# Patient Record
Sex: Female | Born: 1990 | Hispanic: Yes | Marital: Single | Smoking: Never smoker
Health system: Southern US, Community
[De-identification: ages and names within clinical notes are randomized; demographics above are authoritative.]

---

## 2011-12-08 ENCOUNTER — Emergency Department: Payer: Self-pay | Admitting: Emergency Medicine

## 2011-12-08 LAB — URINALYSIS, COMPLETE
Bilirubin,UR: NEGATIVE
Glucose,UR: NEGATIVE mg/dL (ref 0–75)
Ketone: NEGATIVE
Nitrite: NEGATIVE
RBC,UR: 7 /HPF (ref 0–5)
Specific Gravity: 1.025 (ref 1.003–1.030)
Squamous Epithelial: 30
WBC UR: 17 /HPF (ref 0–5)

## 2011-12-08 LAB — COMPREHENSIVE METABOLIC PANEL
Alkaline Phosphatase: 147 U/L — ABNORMAL HIGH (ref 50–136)
Anion Gap: 7 (ref 7–16)
Bilirubin,Total: 0.5 mg/dL (ref 0.2–1.0)
Calcium, Total: 9.1 mg/dL (ref 8.5–10.1)
Chloride: 107 mmol/L (ref 98–107)
Co2: 26 mmol/L (ref 21–32)
Creatinine: 0.66 mg/dL (ref 0.60–1.30)
EGFR (African American): >60
EGFR (Non-African Amer.): >60
SGPT (ALT): 31 U/L

## 2011-12-08 LAB — CBC WITH DIFFERENTIAL/PLATELET
Basophil %: 0.2 %
Eosinophil #: 0.1 10*3/uL (ref 0.0–0.7)
HCT: 46.1 % (ref 35.0–47.0)
HGB: 15.6 g/dL (ref 12.0–16.0)
Lymphocyte #: 0.8 10*3/uL — ABNORMAL LOW (ref 1.0–3.6)
MCH: 31.6 pg (ref 26.0–34.0)
MCHC: 33.9 g/dL (ref 32.0–36.0)
Monocyte #: 0.3 10*3/uL (ref 0.0–0.7)
Neutrophil %: 81.9 %
Platelet: 193 10*3/uL (ref 150–440)
RBC: 4.95 10*6/uL (ref 3.80–5.20)

## 2011-12-08 LAB — PREGNANCY, URINE: Pregnancy Test, Urine: NEGATIVE m[IU]/mL

## 2015-01-15 ENCOUNTER — Ambulatory Visit (INDEPENDENT_AMBULATORY_CARE_PROVIDER_SITE_OTHER): Payer: Medicaid Other | Admitting: Podiatry

## 2015-01-15 ENCOUNTER — Encounter: Payer: Self-pay | Admitting: Podiatry

## 2015-01-15 VITALS — Ht 60.0 in | Wt 145.0 lb

## 2015-01-15 DIAGNOSIS — L603 Nail dystrophy: Secondary | ICD-10-CM

## 2015-01-15 DIAGNOSIS — B351 Tinea unguium: Secondary | ICD-10-CM | POA: Diagnosis not present

## 2015-01-15 NOTE — Progress Notes (Signed)
   Subjective:    Patient ID: Bosie HelperMaria Dimas Morales, female    DOB: 08/25/1991, 24 y.o.   MRN: [id number]  HPI Patient presents today with complaints of right big toe thickening and discoloration. She states that about 4 years ago she had the toenail taken off and it grew back the same. She states the nail is painful with shoegear. Denies any redness or drainage along the nail site. She did try some OTC treatments, but cannot recall the names. She used these medications for a little over 2 months without any resolution. No other complaints at this time.    Review of Systems  All other systems reviewed and are negative.      Objective:   Physical Exam AAO x3, NAD DP/PT pulses palpable bilaterally, CRT less than 3 seconds Protective sensation intact with Simms Weinstein monofilament, vibratory sensation intact, Achilles tendon reflex intact Right hallux with hypertrophic, dystrophic, brittle, discolored, elongated. There is no surrounding erythema or drainage from the nail sites. There is also slight hypertrophy, dystrophy, and discoloation of bilateral fifth digit toenails. There is no tenderness to palpation around the nails there is no swelling erythema or drainage. There is a small amount of dry, pealing skin with slight erythematous base and additionally consistent with tinea pedis. No areas of tenderness to bilateral lower extremities. MMT 5/5, ROM WNL.  No open lesions or pre-ulcerative lesions.  No overlying edema, erythema, increase in warmth to bilateral lower extremities.  No pain with calf compression, swelling, warmth, erythema bilaterally.       Assessment & Plan:  24 year old female with onychodystrophy right hallux, bilateral fifth digit toenails; likely onychomycosis -Treatment options were discussed the patient including alternatives, risks, complications -Discussed. Treatment options for onychomycosis. At today's appointment the nails were cultured and [facility]  evaluation of onychomycosis. She would likely benefit from Lamisil however we will await the results of the biopsy before proceeding with treatment. -Can use OTC athletes foot treatment. If using lamisil, should clear that up as well -Follow-up after nail biopsy or sooner if any problems arise. In the meantime call with any questions/concerns/change in symptoms.

## 2015-01-15 NOTE — Patient Instructions (Signed)

## 2015-02-05 ENCOUNTER — Ambulatory Visit (INDEPENDENT_AMBULATORY_CARE_PROVIDER_SITE_OTHER): Payer: Medicaid Other | Admitting: Podiatry

## 2015-02-05 DIAGNOSIS — B351 Tinea unguium: Secondary | ICD-10-CM | POA: Diagnosis not present

## 2015-02-05 DIAGNOSIS — Z79899 Other long term (current) drug therapy: Secondary | ICD-10-CM

## 2015-02-05 MED ORDER — TERBINAFINE HCL 250 MG PO TABS: 250.0000 mg | ORAL_TABLET | Freq: Every day | ORAL | Status: DC

## 2015-02-05 NOTE — Patient Instructions (Signed)

## 2015-02-06 LAB — CBC WITH DIFFERENTIAL/PLATELET
BASOS: 0 %
Basophils Absolute: 0 10*3/uL (ref 0.0–0.2)
EOS (ABSOLUTE): 0.1 10*3/uL (ref 0.0–0.4)
Eos: 1 %
Hematocrit: 44.2 % (ref 34.0–46.6)
Hemoglobin: 14.9 g/dL (ref 11.1–15.9)
IMMATURE GRANULOCYTES: 0 %
Immature Grans (Abs): 0 10*3/uL (ref 0.0–0.1)
LYMPHS ABS: 2 10*3/uL (ref 0.7–3.1)
Lymphs: 29 %
MCH: 30.8 pg (ref 26.6–33.0)
MCHC: 33.7 g/dL (ref 31.5–35.7)
MCV: 91 fL (ref 79–97)
MONOS ABS: 0.4 10*3/uL (ref 0.1–0.9)
Monocytes: 5 %
Neutrophils Absolute: 4.4 10*3/uL (ref 1.4–7.0)
Neutrophils: 65 %
PLATELETS: 313 10*3/uL (ref 150–379)
RBC: 4.84 x10E6/uL (ref 3.77–5.28)
RDW: 13.9 % (ref 12.3–15.4)
WBC: 6.9 10*3/uL (ref 3.4–10.8)

## 2015-02-06 LAB — HEPATIC FUNCTION PANEL
ALBUMIN: 4.8 g/dL (ref 3.5–5.5)
ALT: 19 IU/L (ref 0–32)
AST: 19 IU/L (ref 0–40)
Alkaline Phosphatase: 132 IU/L — ABNORMAL HIGH (ref 39–117)
BILIRUBIN TOTAL: 0.3 mg/dL (ref 0.0–1.2)
Bilirubin, Direct: 0.08 mg/dL (ref 0.00–0.40)
Total Protein: 7 g/dL (ref 6.0–8.5)

## 2015-02-07 NOTE — Progress Notes (Signed)
Patient ID: Joy HelperMaria Dimas Mckay, female   DOB: 06/16/1991, 24 y.o.   MRN: [id number]  Subjective: 24 year old female presents the office to discuss nail biopsy results. She states since last appointment she has had no change the nails. Denies any redness or drainage along nail sites and she denies any pain to the nails. She states that she is finished breast-feeding. No other complaints at this time no acute changes since last appointment.  Objective: AAO x3, NAD Neurovascular status unchanged.  Right hallux nails hypertrophic, dystrophic, discolored, brittle, elongated. Bilateral fifth digit nails are also hypertrophic, dystrophic. There is noted surrounding erythema or drainage from the nail sites and there is no tenderness to palpation to the nails. No areas of tenderness to bilateral lower disturbance. No overlying edema, erythema, increase in warmth. No open lesions or pre-ulcerative lesions. No pain with calf compression, swelling, warmth, erythema.  Assessment: 24 year old female onychomycosis  Plan: -Nail biopsy results were discussed the patient which revealed onychomycosis (T. Rubrum). I discussed the various treatment options for onychomycosis. This time she likes to proceed with Lamisil. I discussed with her risks and side effects the medication for which she understands. A prescription for lab work was given to the patient as well as a 30 day supply for Lamisil. I encouraged her not to take the Lamisil until the results of the blood work are obtained and she has called. She verbally understood this. Follow-up 4 weeks after starting Lamisil or sooner if any palms are to arise. Call with questions or concerns in the meantime.

## 2015-02-14 ENCOUNTER — Telehealth: Payer: Self-pay | Admitting: *Deleted

## 2015-02-14 NOTE — Telephone Encounter (Signed)
Left message for patient to call us back regarding her lab results.

## 2015-02-14 NOTE — Telephone Encounter (Signed)
Spoke to patient regarding labs,

## 2015-03-05 ENCOUNTER — Ambulatory Visit: Payer: Medicaid Other | Admitting: Podiatry

## 2015-04-09 ENCOUNTER — Encounter: Payer: Self-pay | Admitting: *Deleted

## 2015-04-09 ENCOUNTER — Emergency Department
Admission: EM | Admit: 2015-04-09 | Discharge: 2015-04-10 | Disposition: A | Discharge status: Home / Self Care | Payer: Medicaid Other | Attending: Emergency Medicine | Admitting: Emergency Medicine

## 2015-04-09 ENCOUNTER — Emergency Department: Payer: Medicaid Other

## 2015-04-09 DIAGNOSIS — R109 Unspecified abdominal pain: Secondary | ICD-10-CM | POA: Diagnosis present

## 2015-04-09 DIAGNOSIS — K529 Noninfective gastroenteritis and colitis, unspecified: Secondary | ICD-10-CM | POA: Diagnosis not present

## 2015-04-09 DIAGNOSIS — Z3202 Encounter for pregnancy test, result negative: Secondary | ICD-10-CM | POA: Insufficient documentation

## 2015-04-09 DIAGNOSIS — R197 Diarrhea, unspecified: Secondary | ICD-10-CM

## 2015-04-09 DIAGNOSIS — R1032 Left lower quadrant pain: Secondary | ICD-10-CM

## 2015-04-09 LAB — URINALYSIS COMPLETE WITH MICROSCOPIC (ARMC ONLY)
Bacteria, UA: NONE SEEN
Bilirubin Urine: NEGATIVE
Glucose, UA: NEGATIVE mg/dL
Ketones, ur: NEGATIVE mg/dL
Leukocytes, UA: NEGATIVE
NITRITE: NEGATIVE
PH: 6 (ref 5.0–8.0)
PROTEIN: NEGATIVE mg/dL
SPECIFIC GRAVITY, URINE: 1.023 (ref 1.005–1.030)

## 2015-04-09 LAB — CBC WITH DIFFERENTIAL/PLATELET
Basophils Absolute: 0 10*3/uL (ref 0–0.1)
Basophils Relative: 1 %
Eosinophils Absolute: 0 10*3/uL (ref 0–0.7)
Eosinophils Relative: 0 %
HEMATOCRIT: 41.5 % (ref 35.0–47.0)
Hemoglobin: 14.2 g/dL (ref 12.0–16.0)
LYMPHS ABS: 1 10*3/uL (ref 1.0–3.6)
Lymphocytes Relative: 18 %
MCH: 30.7 pg (ref 26.0–34.0)
MCHC: 34.1 g/dL (ref 32.0–36.0)
MCV: 89.9 fL (ref 80.0–100.0)
MONO ABS: 0.4 10*3/uL (ref 0.2–0.9)
Monocytes Relative: 7 %
NEUTROS PCT: 74 %
Neutro Abs: 4.3 10*3/uL (ref 1.4–6.5)
Platelets: 203 10*3/uL (ref 150–440)
RBC: 4.62 MIL/uL (ref 3.80–5.20)
RDW: 12.4 % (ref 11.5–14.5)
WBC: 5.8 10*3/uL (ref 3.6–11.0)

## 2015-04-09 LAB — COMPREHENSIVE METABOLIC PANEL
ALK PHOS: 120 U/L (ref 38–126)
ALT: 22 U/L (ref 14–54)
ANION GAP: 7 (ref 5–15)
AST: 26 U/L (ref 15–41)
Albumin: 4.2 g/dL (ref 3.5–5.0)
BUN: 11 mg/dL (ref 6–20)
CO2: 27 mmol/L (ref 22–32)
Calcium: 8.9 mg/dL (ref 8.9–10.3)
Chloride: 106 mmol/L (ref 101–111)
Creatinine, Ser: 0.79 mg/dL (ref 0.44–1.00)
GFR calc non Af Amer: >60 mL/min (ref >60)
GLUCOSE: 92 mg/dL (ref 65–99)
POTASSIUM: 3.6 mmol/L (ref 3.5–5.1)
Sodium: 140 mmol/L (ref 135–145)
Total Bilirubin: 0.5 mg/dL (ref 0.3–1.2)
Total Protein: 7.2 g/dL (ref 6.5–8.1)

## 2015-04-09 LAB — POCT PREGNANCY, URINE: PREG TEST UR: NEGATIVE

## 2015-04-09 LAB — LIPASE, BLOOD: Lipase: 30 U/L (ref 22–51)

## 2015-04-09 MED ORDER — IOHEXOL 240 MG/ML SOLN
25.0000 mL | Freq: Once | INTRAMUSCULAR | Status: AC | PRN
  Administered 2015-04-09: 25 mL via ORAL

## 2015-04-09 MED ORDER — ONDANSETRON HCL 4 MG/2ML IJ SOLN
4.0000 mg | Freq: Once | INTRAMUSCULAR | Status: AC
  Administered 2015-04-09: 4 mg via INTRAVENOUS
  Filled 2015-04-09: qty 2

## 2015-04-09 MED ORDER — IOHEXOL 300 MG/ML  SOLN
100.0000 mL | Freq: Once | INTRAMUSCULAR | Status: AC | PRN
  Administered 2015-04-09: 100 mL via INTRAVENOUS

## 2015-04-09 MED ORDER — SODIUM CHLORIDE 0.9 % IV BOLUS (SEPSIS)
1000.0000 mL | Freq: Once | INTRAVENOUS | Status: AC
  Administered 2015-04-09: 1000 mL via INTRAVENOUS

## 2015-04-09 NOTE — ED Notes (Signed)
Pt has low abd pain.  Sx for approx 5 days.  Pt has diarrhea x 10 today.  No vomiting.  Pt has nausea.  No vag bleeding.  No vag discharge.  No dysuria.  Pt reports low back pain.

## 2015-04-09 NOTE — ED Provider Notes (Signed)
[facility] Emergency Department Provider Note  Time seen: 10:13 PM  I have reviewed the triage vital signs and the nursing notes.   HISTORY  Chief Complaint Abdominal Pain    HPI Bosie HelperMaria Dimas Morales is a 24 y.o. female with no past medical history presents the emergency department for lower abdominal cramping and diarrhea 5 days. According to the patient for the past 5 days she has been nauseated, but denies any vomiting, she states lower abdominal mostly left lower quadrant pain with persistent diarrhea up to 10-20 episodes per day. Denies any dysuria, denies vaginal bleeding, denies vaginal discharge, does not believe she is at risk for any STDs. Patient describes her abdominal pain is moderate. No modifying factors identified. Describes loose stool but denies any bloody stool.     No past medical history on file.  There are no active problems to display for this patient.   No past surgical history on file.  Current Outpatient Rx  Name  Route  Sig  Dispense  Refill  . terbinafine (LAMISIL) 250 MG tablet   Oral   Take 1 tablet (250 mg total) by mouth daily.   30 tablet   0     Allergies Review of patient's allergies indicates no known allergies.  No family history on file.  Social History History  Substance Use Topics  . Smoking status: Never Smoker   . Smokeless tobacco: Never Used  . Alcohol Use: No    Review of Systems Constitutional: As it for fever in the emergency department. Patient was unaware she had a fever prior to coming to the ED. Cardiovascular: Negative for chest pain. Respiratory: Negative for shortness of breath. Gastrointestinal: Positive for left lower quadrant abdominal pain. Positive for nausea. Positive for diarrhea. Negative for vomiting. Genitourinary: Negative for dysuria. Negative for vaginal bleeding or discharge. Musculoskeletal: She states mild lower back pain. 10-point ROS otherwise  negative.  ____________________________________________   PHYSICAL EXAM:  VITAL SIGNS: ED Triage Vitals  Enc Vitals Group     BP 04/09/15 2053 116/61 mmHg     Pulse --      Resp 04/09/15 2053 0     Temp 04/09/15 2053 100.7 F (38.2 C)     Temp Source 04/09/15 2053 Oral     SpO2 04/09/15 2053 99 %     Weight 04/09/15 2053 145 lb (65.772 kg)     Height 04/09/15 2053 5' (1.524 m)     Head Cir --      Peak Flow --      Pain Score 04/09/15 2055 8     Pain Loc --      Pain Edu? --      Excl. in GC? --     Constitutional: Alert and oriented. Well appearing and in no distress. Eyes: Normal exam ENT   Mouth/Throat: Mucous membranes are moist. Cardiovascular: Normal rate, regular rhythm. No murmur Respiratory: Normal respiratory effort without tachypnea nor retractions. Breath sounds are clear  Gastrointestinal: Soft, moderate left lower quadrant abdominal tenderness palpation. No rebound or guarding. No distention. No CVA tenderness palpation. Musculoskeletal: Nontender with normal range of motion in all extremities. No lower extremity tenderness or edema. Neurologic:  Normal speech and language. No gross focal neurologic deficits Skin:  Skin is warm, dry and intact.  Psychiatric: Mood and affect are normal. Speech and behavior are normal. ____________________________________________    RADIOLOGY  CT abdomen and pelvis pending  ____________________________________________   INITIAL IMPRESSION / ASSESSMENT AND PLAN /  ED COURSE  Pertinent labs & imaging results that were available during my care of the patient were reviewed by me and considered in my medical decision making (see chart for details).  Patient with 5 days of lower abdominal pain, diffuse diarrhea, and now fever to 100.7. Patient does have moderate left lower quadrant abdominal tenderness palpation. Currently awaiting lab results. We'll start an IV, begin IV hydration, pain and nausea medication. Likely will  require a CT scan for further evaluation given her abdominal pain with fever.   Discussed with the patient's CT abdomen and pelvis, she is agreeable. We will proceed with CT scan to further evaluate. Patient care signed out to Dr. Dolores Frame.  ____________________________________________   FINAL CLINICAL IMPRESSION(S) / ED DIAGNOSES  Left lower quadrant abdominal pain Fever  Minna Antis, MD 04/09/15 [id number]

## 2015-04-10 MED ORDER — METRONIDAZOLE 500 MG PO TABS
500.0000 mg | ORAL_TABLET | Freq: Once | ORAL | Status: AC
  Administered 2015-04-10: 500 mg via ORAL
  Filled 2015-04-10: qty 1

## 2015-04-10 MED ORDER — ONDANSETRON HCL 4 MG PO TABS: 4.0000 mg | ORAL_TABLET | Freq: Three times a day (TID) | ORAL | Status: DC | PRN

## 2015-04-10 MED ORDER — CIPROFLOXACIN HCL 500 MG PO TABS: 500.0000 mg | ORAL_TABLET | Freq: Two times a day (BID) | ORAL | Status: DC

## 2015-04-10 MED ORDER — CIPROFLOXACIN HCL 500 MG PO TABS
500.0000 mg | ORAL_TABLET | Freq: Once | ORAL | Status: AC
  Administered 2015-04-10: 500 mg via ORAL
  Filled 2015-04-10: qty 1

## 2015-04-10 MED ORDER — DICYCLOMINE HCL 20 MG PO TABS: 20.0000 mg | ORAL_TABLET | Freq: Four times a day (QID) | ORAL | Status: DC | PRN

## 2015-04-10 MED ORDER — METRONIDAZOLE 500 MG PO TABS: 500.0000 mg | ORAL_TABLET | Freq: Two times a day (BID) | ORAL | Status: DC

## 2015-04-10 NOTE — ED Provider Notes (Signed)
-----------------------------------------   1:07 AM on 04/10/2015 -----------------------------------------  CT abdomen and pelvis with contrast interpreted per Dr. Cherly Hensenhang: Unremarkable contrast-enhanced CT of the abdomen and pelvis.  Patient is resting in no acute distress. Voices no complaints currently. Updated patient of CT results. Discussed with patient; will empirically treat with a short course of Cipro and Flagyl for colitis. Strict return precautions given. Patient verbalizes understanding and agree with plan of care.  Irean HongJade J Evangela Heffler, MD 04/10/15 [phone]

## 2015-04-10 NOTE — Discharge Instructions (Signed)
1. Take antibiotics as prescribed (Cipro/Flagyl 500mg  twice daily x 5 days). 2. Take medicines as needed for abdominal discomfort and nausea (Bentyl/Zofran #20). 3. Eat a BRAT diet for 5 days, then slowly advance diet as tolerated. 4. Return to the ER for worsening symptoms, persistent vomiting, difficulty breathing or other concerns.  Abdominal Pain Many things can cause abdominal pain. Usually, abdominal pain is not caused by a disease and will improve without treatment. It can often be observed and treated at home. Your health care provider will do a physical exam and possibly order blood tests and X-rays to help determine the seriousness of your pain. However, in many cases, more time must pass before a clear cause of the pain can be found. Before that point, your health care provider may not know if you need more testing or further treatment. HOME CARE INSTRUCTIONS  Monitor your abdominal pain for any changes. The following actions may help to alleviate any discomfort you are experiencing:  Only take over-the-counter or prescription medicines as directed by your health care provider.  Do not take laxatives unless directed to do so by your health care provider.  Try a clear liquid diet (broth, tea, or water) as directed by your health care provider. Slowly move to a bland diet as tolerated. SEEK MEDICAL CARE IF:  You have unexplained abdominal pain.  You have abdominal pain associated with nausea or diarrhea.  You have pain when you urinate or have a bowel movement.  You experience abdominal pain that wakes you in the night.  You have abdominal pain that is worsened or improved by eating food.  You have abdominal pain that is worsened with eating fatty foods.  You have a fever. SEEK IMMEDIATE MEDICAL CARE IF:   Your pain does not go away within 2 hours.  You keep throwing up (vomiting).  Your pain is felt only in portions of the abdomen, such as the right side or the left  lower portion of the abdomen.  You pass bloody or black tarry stools. MAKE SURE YOU:  Understand these instructions.   Will watch your condition.   Will get help right away if you are not doing well or get worse.  Document Released: 06/17/2005 Document Revised: 09/12/2013 Document Reviewed: 05/17/2013 [facility] Patient Information 2015 [place]. This information is not intended to replace advice given to you by your health care provider. Make sure you discuss any questions you have with your health care provider.  Colitis Colitis is inflammation of the colon. Colitis can be a short-term or long-standing (chronic) illness. Crohn's disease and ulcerative colitis are 2 types of colitis which are chronic. They usually require lifelong treatment. CAUSES  There are many different causes of colitis, including:  Viruses.  Germs (bacteria).  Medicine reactions. SYMPTOMS   Diarrhea.  Intestinal bleeding.  Pain.  Fever.  Throwing up (vomiting).  Tiredness (fatigue).  Weight loss.  Bowel blockage. DIAGNOSIS  The diagnosis of colitis is based on examination and stool or blood tests. X-rays, CT scan, and colonoscopy may also be needed. TREATMENT  Treatment may include:  Fluids given through the vein (intravenously).  Bowel rest (nothing to eat or drink for a period of time).  Medicine for pain and diarrhea.  Medicines (antibiotics) that kill germs.  Cortisone medicines.  Surgery. HOME CARE INSTRUCTIONS   Get plenty of rest.  Drink enough water and fluids to keep your urine clear or pale yellow.  Eat a well-balanced diet.  Call your caregiver for  follow-up as recommended. SEEK IMMEDIATE MEDICAL CARE IF:   You develop chills.  You have an oral temperature above 102 F (38.9 C), not controlled by medicine.  You have extreme weakness, fainting, or dehydration.  You have repeated vomiting.  You develop severe belly (abdominal) pain or are passing  bloody or tarry stools. MAKE SURE YOU:   Understand these instructions.  Will watch your condition.  Will get help right away if you are not doing well or get worse. Document Released: 10/15/2004 Document Revised: 11/30/2011 Document Reviewed: 01/10/2010 [facility] Patient Information 2015 [place], [place]. This information is not intended to replace advice given to you by your health care provider. Make sure you discuss any questions you have with your health care provider.  Diarrhea Diarrhea is frequent loose and watery bowel movements. It can cause you to feel weak and dehydrated. Dehydration can cause you to become tired and thirsty, have a dry mouth, and have decreased urination that often is dark yellow. Diarrhea is a sign of another problem, most often an infection that will not last long. In most cases, diarrhea typically lasts 2-3 days. However, it can last longer if it is a sign of something more serious. It is important to treat your diarrhea as directed by your caregiver to lessen or prevent future episodes of diarrhea. CAUSES  Some common causes include:  Gastrointestinal infections caused by viruses, bacteria, or parasites.  Food poisoning or food allergies.  Certain medicines, such as antibiotics, chemotherapy, and laxatives.  Artificial sweeteners and fructose.  Digestive disorders. HOME CARE INSTRUCTIONS  Ensure adequate fluid intake (hydration): Have 1 cup (8 oz) of fluid for each diarrhea episode. Avoid fluids that contain simple sugars or sports drinks, fruit juices, whole milk products, and sodas. Your urine should be clear or pale yellow if you are drinking enough fluids. Hydrate with an oral rehydration solution that you can purchase at pharmacies, retail stores, and online. You can prepare an oral rehydration solution at home by mixing the following ingredients together:   - tsp table salt.   tsp baking soda.   tsp salt substitute containing potassium  chloride.  1  tablespoons sugar.  1 L (34 oz) of water.  Certain foods and beverages may increase the speed at which food moves through the gastrointestinal (GI) tract. These foods and beverages should be avoided and include:  Caffeinated and alcoholic beverages.  High-fiber foods, such as raw fruits and vegetables, nuts, seeds, and whole grain breads and cereals.  Foods and beverages sweetened with sugar alcohols, such as xylitol, sorbitol, and mannitol.  Some foods may be well tolerated and may help thicken stool including:  Starchy foods, such as rice, toast, pasta, low-sugar cereal, oatmeal, grits, baked potatoes, crackers, and bagels.  Bananas.  Applesauce.  Add probiotic-rich foods to help increase healthy bacteria in the GI tract, such as yogurt and fermented milk products.  Wash your hands well after each diarrhea episode.  Only take over-the-counter or prescription medicines as directed by your caregiver.  Take a warm bath to relieve any burning or pain from frequent diarrhea episodes. SEEK IMMEDIATE MEDICAL CARE IF:   You are unable to keep fluids down.  You have persistent vomiting.  You have blood in your stool, or your stools are black and tarry.  You do not urinate in 6-8 hours, or there is only a small amount of very dark urine.  You have abdominal pain that increases or localizes.  You have weakness, dizziness, confusion, or light-headedness.  You have a severe headache.  Your diarrhea gets worse or does not get better.  You have a fever or persistent symptoms for more than 2-3 days.  You have a fever and your symptoms suddenly get worse. MAKE SURE YOU:   Understand these instructions.  Will watch your condition.  Will get help right away if you are not doing well or get worse. Document Released: 08/28/2002 Document Revised: 01/22/2014 Document Reviewed: 05/15/2012 [facility] Patient Information 2015 [place], [place]. This information is not  intended to replace advice given to you by your health care provider. Make sure you discuss any questions you have with your health care provider.

## 2015-04-10 NOTE — ED Notes (Signed)
Patient with no complaints at this time. Respirations even and unlabored. Skin warm/dry. Discharge instructions reviewed with patient at this time. Patient given opportunity to voice concerns/ask questions. IV removed per policy and band-aid applied to site. Patient discharged at this time and left Emergency Department with steady gait.  

## 2018-08-22 ENCOUNTER — Encounter: Payer: Self-pay | Admitting: Emergency Medicine

## 2018-08-22 ENCOUNTER — Other Ambulatory Visit: Payer: Self-pay

## 2018-08-22 DIAGNOSIS — X58XXXA Exposure to other specified factors, initial encounter: Secondary | ICD-10-CM | POA: Insufficient documentation

## 2018-08-22 DIAGNOSIS — Y929 Unspecified place or not applicable: Secondary | ICD-10-CM | POA: Insufficient documentation

## 2018-08-22 DIAGNOSIS — Y939 Activity, unspecified: Secondary | ICD-10-CM | POA: Insufficient documentation

## 2018-08-22 DIAGNOSIS — Y999 Unspecified external cause status: Secondary | ICD-10-CM | POA: Diagnosis not present

## 2018-08-22 DIAGNOSIS — T161XXA Foreign body in right ear, initial encounter: Secondary | ICD-10-CM | POA: Diagnosis not present

## 2018-08-22 MED ORDER — LIDOCAINE HCL (PF) 1 % IJ SOLN
INTRAMUSCULAR | Status: AC
  Administered 2018-08-23: 5 mL
  Filled 2018-08-22: qty 5

## 2018-08-22 NOTE — ED Triage Notes (Signed)
Patient ambulatory to triage with steady gait, without difficulty or distress noted; pt reports awoke with bug in right ear

## 2018-08-23 ENCOUNTER — Emergency Department
Admission: EM | Admit: 2018-08-23 | Discharge: 2018-08-23 | Disposition: A | Discharge status: Home / Self Care | Payer: Medicaid Other | Attending: Emergency Medicine | Admitting: Emergency Medicine

## 2018-08-23 DIAGNOSIS — T161XXA Foreign body in right ear, initial encounter: Secondary | ICD-10-CM

## 2018-08-23 MED ORDER — LIDOCAINE HCL (PF) 1 % IJ SOLN
5.0000 mL | Freq: Once | INTRAMUSCULAR | Status: AC
  Administered 2018-08-23: 5 mL

## 2018-08-23 NOTE — ED Provider Notes (Signed)
[facility] Emergency Department Provider Note   Time seen: 12:00 AM.  I have reviewed the triage vital signs and the nursing notes.   HISTORY  Chief Complaint Foreign Body    HPI Joy Mckay is a 27 y.o. female presents to the emergency department with complaint of an insect in her right ear. Patient states about his been in her ear approximately one hour.   past medical history None There are no active problems to display for this patient.   History reviewed. No pertinent surgical history.  Prior to Admission medications   Medication Sig Start Date End Date Taking? Authorizing Provider  ciprofloxacin (CIPRO) 500 MG tablet Take 1 tablet (500 mg total) by mouth 2 (two) times daily. 04/10/15   Irean Hong, MD  dicyclomine (BENTYL) 20 MG tablet Take 1 tablet (20 mg total) by mouth every 6 (six) hours as needed. 04/10/15   Irean Hong, MD  metroNIDAZOLE (FLAGYL) 500 MG tablet Take 1 tablet (500 mg total) by mouth 2 (two) times daily. 04/10/15   Irean Hong, MD  ondansetron (ZOFRAN) 4 MG tablet Take 1 tablet (4 mg total) by mouth every 8 (eight) hours as needed for nausea or vomiting. 04/10/15   Irean Hong, MD  terbinafine (LAMISIL) 250 MG tablet Take 1 tablet (250 mg total) by mouth daily. 02/05/15   Vivi Barrack, DPM    Allergies no known drug allergies No family history on file.  Social History Social History   Tobacco Use  . Smoking status: Never Smoker  . Smokeless tobacco: Never Used  Substance Use Topics  . Alcohol use: No    Alcohol/week: 0.0 standard drinks  . Drug use: Not on file    Review of Systems Constitutional: No fever/chills Eyes: No visual changes. ENT: No sore throat.positive for right ear foreign body Cardiovascular: Denies chest pain. Respiratory: Denies shortness of breath. Gastrointestinal: No abdominal pain.  No nausea, no vomiting.  No diarrhea.  No constipation. Genitourinary: Negative for  dysuria. Musculoskeletal: Negative for neck pain.  Negative for back pain. Integumentary: Negative for rash. Neurological: Negative for headaches, focal weakness or numbness.   ____________________________________________   PHYSICAL EXAM:  VITAL SIGNS: ED Triage Vitals  Enc Vitals Group     BP 08/22/18 2357 120/65     Pulse Rate 08/22/18 2357 81     Resp 08/22/18 2357 16     Temp 08/22/18 2357 98.6 F (37 C)     Temp Source 08/22/18 2357 Oral     SpO2 08/22/18 2357 98 %     Weight 08/22/18 2348 65.8 kg (145 lb)     Height 08/22/18 2348 1.524 m (5')     Head Circumference --      Peak Flow --      Pain Score 08/22/18 2348 7     Pain Loc --      Pain Edu? --      Excl. in GC? --     Constitutional: Alert and oriented. apparent discomfort. Eyes: Conjunctivae are normal.  Head: Atraumatic. Ears:  insect noted in the right external auditory canal. Nose: No congestion/rhinnorhea. Mouth/Throat: Mucous membranes are moist.  Oropharynx non-erythematous. Neck: No stridor.   Musculoskeletal: No lower extremity tenderness nor edema. No gross deformities of extremities. Neurologic:  Normal speech and language. No gross focal neurologic deficits are appreciated.  Skin:  Skin is warm, dry and intact. No rash noted. Psychiatric: Mood and affect are normal. Speech  and behavior are normal.  ____________________________________________   .Foreign Body Removal Date/Time: 08/23/2018 12:16 AM Performed by: Darci CurrentBrown, [place] N, MD Authorized by: Darci CurrentBrown, [place] N, MD  Consent: Verbal consent obtained. Consent given by: patient Patient identity confirmed: verbally with patient and arm band Body area: ear Location details: right ear Anesthesia: local infiltration  Anesthesia: Local Anesthetic: lidocaine 1% without epinephrine Anesthetic total: 5 mL  Sedation: Patient sedated: no  Patient restrained: no Patient cooperative: yes Removal mechanism: alligator forceps Complexity:  simple Post-procedure assessment: foreign body removed Patient tolerance: Patient tolerated the procedure well with no immediate complications     ____________________________________________   INITIAL IMPRESSION / ASSESSMENT AND PLAN / ED COURSE  As part of my medical decision making, I reviewed the following data within the electronic MEDICAL RECORD NUMBER   27 year old female presenting with above stated history of physical exam secondary to insect in the right external auditory canal which was removed without difficulty._____________________________  FINAL CLINICAL IMPRESSION(S) / ED DIAGNOSES  Final diagnoses:  Foreign body of right ear, initial encounter     MEDICATIONS GIVEN DURING THIS VISIT:  Medications  lidocaine (PF) (XYLOCAINE) 1 % injection 5 mL (5 mLs Other Given 08/23/18 0011)     ED Discharge Orders    None       Note:  This document was prepared using Dragon voice recognition software and may include unintentional dictation errors.    Darci CurrentBrown, [place] N, MD 08/23/18 [phone]

## 2018-08-23 NOTE — ED Notes (Signed)
Dr Manson PasseyBrown in triage and removed insect from right ear; pt tolerated well

## 2019-04-12 ENCOUNTER — Other Ambulatory Visit: Payer: Self-pay | Admitting: Internal Medicine

## 2019-04-12 DIAGNOSIS — Z20822 Contact with and (suspected) exposure to covid-19: Secondary | ICD-10-CM

## 2019-04-16 LAB — NOVEL CORONAVIRUS, NAA: SARS-CoV-2, NAA: NOT DETECTED

## 2020-07-30 ENCOUNTER — Emergency Department

## 2020-07-30 ENCOUNTER — Other Ambulatory Visit: Payer: Self-pay

## 2020-07-30 ENCOUNTER — Emergency Department
Admission: EM | Admit: 2020-07-30 | Discharge: 2020-07-30 | Disposition: A | Discharge status: Home / Self Care | Attending: Emergency Medicine | Admitting: Emergency Medicine

## 2020-07-30 DIAGNOSIS — Y990 Civilian activity done for income or pay: Secondary | ICD-10-CM | POA: Diagnosis not present

## 2020-07-30 DIAGNOSIS — S5001XA Contusion of right elbow, initial encounter: Secondary | ICD-10-CM | POA: Diagnosis not present

## 2020-07-30 DIAGNOSIS — S0101XA Laceration without foreign body of scalp, initial encounter: Secondary | ICD-10-CM | POA: Diagnosis not present

## 2020-07-30 DIAGNOSIS — S161XXA Strain of muscle, fascia and tendon at neck level, initial encounter: Secondary | ICD-10-CM | POA: Insufficient documentation

## 2020-07-30 DIAGNOSIS — S0990XA Unspecified injury of head, initial encounter: Secondary | ICD-10-CM | POA: Diagnosis present

## 2020-07-30 DIAGNOSIS — S8002XA Contusion of left knee, initial encounter: Secondary | ICD-10-CM | POA: Diagnosis not present

## 2020-07-30 DIAGNOSIS — Y92481 Parking lot as the place of occurrence of the external cause: Secondary | ICD-10-CM | POA: Insufficient documentation

## 2020-07-30 MED ORDER — MELOXICAM 7.5 MG PO TABS
15.0000 mg | ORAL_TABLET | Freq: Once | ORAL | Status: AC
  Administered 2020-07-30: 15 mg via ORAL
  Filled 2020-07-30: qty 2

## 2020-07-30 MED ORDER — CEPHALEXIN 500 MG PO CAPS
500.0000 mg | ORAL_CAPSULE | Freq: Once | ORAL | Status: AC
  Administered 2020-07-30: 500 mg via ORAL
  Filled 2020-07-30: qty 1

## 2020-07-30 MED ORDER — LIDOCAINE-EPINEPHRINE-TETRACAINE (LET) TOPICAL GEL
3.0000 mL | Freq: Once | TOPICAL | Status: AC
  Administered 2020-07-30: 3 mL via TOPICAL
  Filled 2020-07-30: qty 3

## 2020-07-30 MED ORDER — METHOCARBAMOL 750 MG PO TABS: 750.0000 mg | ORAL_TABLET | Freq: Four times a day (QID) | ORAL | 0 refills | Status: AC | PRN

## 2020-07-30 MED ORDER — METHOCARBAMOL 500 MG PO TABS
750.0000 mg | ORAL_TABLET | Freq: Once | ORAL | Status: AC
  Administered 2020-07-30: 750 mg via ORAL
  Filled 2020-07-30: qty 2

## 2020-07-30 MED ORDER — MELOXICAM 15 MG PO TABS: 15.0000 mg | ORAL_TABLET | Freq: Every day | ORAL | 0 refills | Status: AC

## 2020-07-30 MED ORDER — ACETAMINOPHEN 325 MG PO TABS
650.0000 mg | ORAL_TABLET | Freq: Once | ORAL | Status: AC
  Administered 2020-07-30: 650 mg via ORAL
  Filled 2020-07-30: qty 2

## 2020-07-30 MED ORDER — CEPHALEXIN 500 MG PO CAPS: 500.0000 mg | ORAL_CAPSULE | Freq: Four times a day (QID) | ORAL | 0 refills | Status: AC
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
## 2020-07-30 NOTE — Discharge Instructions (Signed)
These have your staples removed in 5 to 7 days.  Please use the prescribed muscle relaxant and anti-inflammatory for treatment of your pain.  This can be combined with Tylenol.  Please follow-up with your primary care or orthopedics if he has persisting problems with your knee or elbow.

## 2020-07-30 NOTE — ED Notes (Signed)
Pt calm , collective upon discharge. Pt understood discharge instructions.
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
## 2020-11-06 ENCOUNTER — Other Ambulatory Visit: Payer: Self-pay

## 2020-11-06 ENCOUNTER — Ambulatory Visit: Payer: 59 | Admitting: Dermatology

## 2020-11-06 DIAGNOSIS — L83 Acanthosis nigricans: Secondary | ICD-10-CM

## 2020-11-06 DIAGNOSIS — L309 Dermatitis, unspecified: Secondary | ICD-10-CM | POA: Diagnosis not present

## 2020-11-06 MED ORDER — CLINDAMYCIN PHOSPHATE 1 % EX LOTN: TOPICAL_LOTION | Freq: Every day | CUTANEOUS | 0 refills | Status: AC

## 2020-11-06 MED ORDER — PIMECROLIMUS 1 % EX CREA: TOPICAL_CREAM | Freq: Two times a day (BID) | CUTANEOUS | 0 refills | Status: DC

## 2020-11-06 MED ORDER — DOXYCYCLINE MONOHYDRATE 100 MG PO CAPS: 100.0000 mg | ORAL_CAPSULE | Freq: Two times a day (BID) | ORAL | 0 refills | Status: DC

## 2020-11-06 NOTE — Patient Instructions (Signed)
Doxycycline should be taken with food to prevent nausea. Do not lay down for 30 minutes after taking. Be cautious with sun exposure and use good sun protection while on this medication. Pregnant women should not take this medication.   Start Elidel 1% cream. Apply BID to bilateral axillae. Start clindamycin 1% lotion. Apply once daily to bilateral axillae.  Discontinue use of deodorant for now until clear, then may restart.  My use Zeasorb powder after creams have soaked into skin if needed for sweating.

## 2020-11-06 NOTE — Progress Notes (Signed)
   Follow-Up Visit   Subjective  Joy Mckay is a 30 y.o. female who presents for the following: Skin Problem (Pt c/o of itchy, painful bumps on left axilla for approx 2-3 months. States that it has started on right axilla approx 2 weeks ago. ).  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  She switched her Secret deodorant to one with a different scent, but recently has stopped using it altogether.    The following portions of the chart were reviewed this encounter and updated as appropriate:      Review of Systems: No other skin or systemic complaints except as noted in HPI or Assessment and Plan.   Objective  Well appearing patient in no apparent distress; mood and affect are within normal limits.  A focused examination was performed including bilateral axillae. Relevant physical exam findings are noted in the Assessment and Plan.  Objective  bilateral axillae: Small follicular based pink papules, very itchy per pt  Objective  BL Axilla, post neck: Velvety brown pigmented plaque  Assessment & Plan  Dermatitis bilateral axillae  VS Folliculitis Chronic condition x several months with flare.  May be related to sweating/irritation under uniform  Start Doxycycline 100 mg BID x 2 weeks.  Doxycycline should be taken with food to prevent nausea. Do not lay down for 30 minutes after taking. Be cautious with sun exposure and use good sun protection while on this medication. Pregnant women should not take this medication.   Start Elidel 1% cream. Apply BID to bilateral axillae. Start clindamycin 1% lotion. Apply once daily to bilateral axillae.  Discontinue use of deodorant for now until clear, then may restart.  My use Zeasorb powder after creams have soaked into skin if needed for sweating under uniform.   pimecrolimus (ELIDEL) 1 % cream - bilateral axillae  clindamycin (CLEOCIN-T) 1 % lotion -  bilateral axillae  doxycycline (MONODOX) 100 MG capsule - bilateral axillae  Acanthosis nigricans BL Axilla, post neck  Benign, observe Chronic condition Pt not diabetic.  Discussed that this can be a sign to susceptibility for diabetes. Recommend good diet (minimize sugar, increase fruite/vegetables) and regular exercise.  Weight loss may help.  Return in about 1 month (around 12/04/2020).   I, Epifania Gore, CMA, am acting as scribe for Willeen Niece, MD.  Documentation: I have reviewed the above documentation for accuracy and completeness, and I agree with the above.  Willeen Niece MD

## 2020-12-04 ENCOUNTER — Ambulatory Visit: Payer: 59 | Admitting: Dermatology

## 2020-12-24 ENCOUNTER — Other Ambulatory Visit: Payer: Self-pay

## 2020-12-24 MED ORDER — TACROLIMUS 0.1 % EX OINT: TOPICAL_OINTMENT | Freq: Two times a day (BID) | CUTANEOUS | 0 refills | Status: DC

## 2020-12-24 NOTE — Progress Notes (Signed)
Elidel not covered per patient by insurance. Tacrolimus sent in as replacement.

## 2021-03-04 ENCOUNTER — Other Ambulatory Visit: Payer: Self-pay

## 2021-03-04 ENCOUNTER — Ambulatory Visit (INDEPENDENT_AMBULATORY_CARE_PROVIDER_SITE_OTHER): Payer: 59 | Admitting: Dermatology

## 2021-03-04 DIAGNOSIS — L739 Follicular disorder, unspecified: Secondary | ICD-10-CM

## 2021-03-04 DIAGNOSIS — R21 Rash and other nonspecific skin eruption: Secondary | ICD-10-CM

## 2021-03-04 DIAGNOSIS — L249 Irritant contact dermatitis, unspecified cause: Secondary | ICD-10-CM

## 2021-03-04 DIAGNOSIS — L83 Acanthosis nigricans: Secondary | ICD-10-CM

## 2021-03-04 NOTE — Progress Notes (Signed)
   Follow-Up Visit   Subjective  Joy Mckay is a 30 y.o. female who presents for the following: Follow-up (Patient here for 4 month follow-up Dermatitis vs Folliculitis of the bilateral axilla. After her last appt, she took doxycycline for 2 weeks as prescribed. She is using clindamycin lotion twice daily. Elidel not covered by patient's insurance, so it was switched to tacrolimus 0.1% ointment. Patient was unaware of the change, so she never picked up Rx. Rash is improved a little but still very itchy and burns. She stopped using deodorant for a while, but it didn't improve condition.).    The following portions of the chart were reviewed this encounter and updated as appropriate:        Review of Systems:  No other skin or systemic complaints except as noted in HPI or Assessment and Plan.  Objective  Well appearing patient in no apparent distress; mood and affect are within normal limits.  A focused examination was performed including axilla. Relevant physical exam findings are noted in the Assessment and Plan.  Bilateral Axilla Violaceous hyperpigmented patches.  Bil axilla, post neck Velvety hyperpigmentation.   Assessment & Plan  Rash Bilateral Axilla  Dermatitis (irritant) with Folliculitis, folliculitis is improved (fewer bumps), but still itchy.  Continue Clindamycin lotion QD/BID AA.  Start tacrolimus 0.1% ointment QD/BID (BID x 2 weeks) AA or until rash cleared and prn recurrence. Rx at pharmacy.  Recommend changing deodorant to VaniCream Anti-perspirant.   If folliculitis continues to flare, discussed low-dose PO doxycyline. Saw improvement with 2 week course doxy  Acanthosis nigricans Bil axilla, post neck  Benign, observe.   Can be cutaneous sign of prediabetes.  Will not clear with topical treatment.  Weight loss may be helpful.    Return in about 10 weeks (around 05/13/2021) for f/u rash axilla.  ICherlyn Labella, CMA, am acting as scribe for  Willeen Niece, MD .  Documentation: I have reviewed the above documentation for accuracy and completeness, and I agree with the above.  Willeen Niece MD

## 2021-03-04 NOTE — Patient Instructions (Addendum)
Start tacrolimus ointment twice daily x 2 weeks, then decrease to once daily until rash improved. Decrease clindamycin lotion to once daily. (Apply before tacrolimus)  Look for Pimecrolimus Cream while in [place].  If you have any questions or concerns for your doctor, please call our main line at [phone] and press option 4 to reach your doctor's medical assistant. If no one answers, please leave a voicemail as directed and we will return your call as soon as possible. Messages left after 4 pm will be answered the following business day.   You may also send [place] a message via MyChart. We typically respond to MyChart messages within 1-2 business days.  For prescription refills, please ask your pharmacy to contact our office. Our fax number is [phone].  If you have an urgent issue when the clinic is closed that cannot wait until the next business day, you can page your doctor at the number below.    Please note that while we do our best to be available for urgent issues outside of office hours, we are not available 24/7.   If you have an urgent issue and are unable to reach [place], you may choose to seek medical care at your doctor's office, retail clinic, urgent care center, or emergency room.  If you have a medical emergency, please immediately call 911 or go to the emergency department.  Pager Numbers  - Dr. Gwen Pounds: [phone]  - Dr. Neale Burly: [phone]  - Dr. Roseanne Reno: [phone]  In the event of inclement weather, please call our main line at [phone] for an update on the status of any delays or closures.  Dermatology Medication Tips: Please keep the boxes that topical medications come in in order to help keep track of the instructions about where and how to use these. Pharmacies typically print the medication instructions only on the boxes and not directly on the medication tubes.   If your medication is too expensive, please contact our office at [phone] option 4  or send [place] a message through MyChart.   We are unable to tell what your co-pay for medications will be in advance as this is different depending on your insurance coverage. However, we may be able to find a substitute medication at lower cost or fill out paperwork to get insurance to cover a needed medication.   If a prior authorization is required to get your medication covered by your insurance company, please allow [place] 1-2 business days to complete this process.  Drug prices often vary depending on where the prescription is filled and some pharmacies may offer cheaper prices.  The website www.goodrx.com contains coupons for medications through different pharmacies. The prices here do not account for what the cost may be with help from insurance (it may be cheaper with your insurance), but the website can give you the price if you did not use any insurance.  - You can print the associated coupon and take it with your prescription to the pharmacy.  - You may also stop by our office during regular business hours and pick up a GoodRx coupon card.  - If you need your prescription sent electronically to a different pharmacy, notify our office through [facility] or by phone at [phone] option 4.

## 2021-05-19 ENCOUNTER — Ambulatory Visit: Payer: 59 | Admitting: Dermatology

## 2021-10-09 DIAGNOSIS — B3731 Acute candidiasis of vulva and vagina: Secondary | ICD-10-CM | POA: Diagnosis not present

## 2021-11-03 DIAGNOSIS — Z3A39 39 weeks gestation of pregnancy: Secondary | ICD-10-CM | POA: Diagnosis not present
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
## 2022-05-30 DIAGNOSIS — L819 Disorder of pigmentation, unspecified: Secondary | ICD-10-CM | POA: Diagnosis not present

## 2022-05-30 DIAGNOSIS — Z1331 Encounter for screening for depression: Secondary | ICD-10-CM | POA: Diagnosis not present

## 2022-05-30 DIAGNOSIS — N760 Acute vaginitis: Secondary | ICD-10-CM | POA: Diagnosis not present

## 2022-05-30 DIAGNOSIS — Z1389 Encounter for screening for other disorder: Secondary | ICD-10-CM | POA: Diagnosis not present

## 2022-07-29 DIAGNOSIS — L603 Nail dystrophy: Secondary | ICD-10-CM | POA: Diagnosis not present

## 2022-07-29 DIAGNOSIS — L408 Other psoriasis: Secondary | ICD-10-CM | POA: Diagnosis not present

## 2022-07-29 DIAGNOSIS — R21 Rash and other nonspecific skin eruption: Secondary | ICD-10-CM | POA: Diagnosis not present

## 2022-09-21 NOTE — L&D Delivery Note (Signed)
Delivery Note  [name] is a [id number] at [redacted]w[redacted]d with an LMP of 08/10/22, inconsistent with [place] at [redacted]w[redacted]d.   First Stage: Labor onset: 1200 Augmentation: oxytocin and AROM Analgesia /Anesthesia intrapartum: epidural AROM at 1848 GBS: negative IP Antibiotics: none  Second Stage: Complete dilation at 2126 Onset of pushing at 2126 FHR second stage 130 bpm with moderate variability with variable decelerations  [name] presented to L&D with uterine contractions. She was 5/70/-3. She progressed  to C/C/+2 with a spontaneous urge to push.  Baby spontaneously delivered without maternal effort for a spontaneous vaginal birth.  Delivery of a viable baby girl on 06/18/23 at 2126 by CNM Delivery of fetal head in OA position with restitution to LOT. no nuchal cord;  Anterior then posterior shoulders delivered easily with gentle downward traction. Baby placed on mom's chest, and attended to by baby RN Cord double clamped after cessation of pulsation, cut by FOB  Cord blood sample collection: Yes O POS Performed at [name], [address] [place], [place] [address]  Collection of cord blood donation no Arterial cord blood sample  no  Third Stage: Oxytocin bolus started after delivery of infant for hemorrhage prophylaxis  Placenta delivered schultz intact with 3 VC @ 2133 Placenta disposition: discarded per protocol Uterine tone firm / bleeding scant  no laceration identified  Anesthesia for repair: none Repair n/a Est. Blood Loss (mL): 25cc  Complications: none   Mom to postpartum.  Baby to Couplet care / Skin to Skin.  Newborn: Information for the patient's newborn:  [name], Girl Zykera [[id number]]  Live born female "[name]" Birth Weight:   APGAR: 8, 9  Newborn Delivery   Birth date/time: 06/18/2023 21:26:00 Delivery type: Vaginal, Spontaneous      Feeding planned: breast and formula feeding  ---------- Chari Manning, CNM Certified Nurse  Midwife [facility]

## 2022-10-13 DIAGNOSIS — L408 Other psoriasis: Secondary | ICD-10-CM | POA: Diagnosis not present

## 2022-10-13 DIAGNOSIS — R21 Rash and other nonspecific skin eruption: Secondary | ICD-10-CM | POA: Diagnosis not present

## 2022-11-16 DIAGNOSIS — N912 Amenorrhea, unspecified: Secondary | ICD-10-CM | POA: Diagnosis not present

## 2022-12-07 DIAGNOSIS — Z3490 Encounter for supervision of normal pregnancy, unspecified, unspecified trimester: Secondary | ICD-10-CM | POA: Insufficient documentation

## 2022-12-08 DIAGNOSIS — Z113 Encounter for screening for infections with a predominantly sexual mode of transmission: Secondary | ICD-10-CM | POA: Diagnosis not present

## 2022-12-08 DIAGNOSIS — Z3481 Encounter for supervision of other normal pregnancy, first trimester: Secondary | ICD-10-CM | POA: Diagnosis not present

## 2022-12-08 DIAGNOSIS — Z1329 Encounter for screening for other suspected endocrine disorder: Secondary | ICD-10-CM | POA: Diagnosis not present

## 2022-12-08 DIAGNOSIS — Z131 Encounter for screening for diabetes mellitus: Secondary | ICD-10-CM | POA: Diagnosis not present

## 2022-12-08 DIAGNOSIS — O99210 Obesity complicating pregnancy, unspecified trimester: Secondary | ICD-10-CM | POA: Diagnosis not present

## 2022-12-08 LAB — OB RESULTS CONSOLE VARICELLA ZOSTER ANTIBODY, IGG: Varicella: IMMUNE

## 2022-12-08 LAB — OB RESULTS CONSOLE HIV ANTIBODY (ROUTINE TESTING): HIV: NONREACTIVE

## 2022-12-08 LAB — OB RESULTS CONSOLE RPR: RPR: NONREACTIVE

## 2022-12-08 LAB — OB RESULTS CONSOLE RUBELLA ANTIBODY, IGM: Rubella: IMMUNE

## 2022-12-08 LAB — OB RESULTS CONSOLE HEPATITIS B SURFACE ANTIGEN: Hepatitis B Surface Ag: NEGATIVE

## 2022-12-15 DIAGNOSIS — Z1389 Encounter for screening for other disorder: Secondary | ICD-10-CM | POA: Diagnosis not present

## 2022-12-15 DIAGNOSIS — M791 Myalgia, unspecified site: Secondary | ICD-10-CM | POA: Diagnosis not present

## 2023-01-04 DIAGNOSIS — Z131 Encounter for screening for diabetes mellitus: Secondary | ICD-10-CM | POA: Diagnosis not present

## 2023-01-04 DIAGNOSIS — O99210 Obesity complicating pregnancy, unspecified trimester: Secondary | ICD-10-CM | POA: Diagnosis not present

## 2023-01-25 DIAGNOSIS — R7309 Other abnormal glucose: Secondary | ICD-10-CM | POA: Diagnosis not present

## 2023-02-01 DIAGNOSIS — Z3481 Encounter for supervision of other normal pregnancy, first trimester: Secondary | ICD-10-CM | POA: Diagnosis not present

## 2023-04-07 DIAGNOSIS — Z23 Encounter for immunization: Secondary | ICD-10-CM | POA: Diagnosis not present

## 2023-04-07 DIAGNOSIS — Z131 Encounter for screening for diabetes mellitus: Secondary | ICD-10-CM | POA: Diagnosis not present

## 2023-04-07 DIAGNOSIS — Z3482 Encounter for supervision of other normal pregnancy, second trimester: Secondary | ICD-10-CM | POA: Diagnosis not present

## 2023-04-08 DIAGNOSIS — R8290 Unspecified abnormal findings in urine: Secondary | ICD-10-CM | POA: Diagnosis not present

## 2023-04-08 DIAGNOSIS — R7309 Other abnormal glucose: Secondary | ICD-10-CM | POA: Diagnosis not present

## 2023-05-25 DIAGNOSIS — O99210 Obesity complicating pregnancy, unspecified trimester: Secondary | ICD-10-CM | POA: Diagnosis not present

## 2023-05-25 DIAGNOSIS — Z3483 Encounter for supervision of other normal pregnancy, third trimester: Secondary | ICD-10-CM | POA: Diagnosis not present

## 2023-05-25 LAB — OB RESULTS CONSOLE GBS: GBS: NEGATIVE

## 2023-05-25 LAB — OB RESULTS CONSOLE GC/CHLAMYDIA
Chlamydia: NEGATIVE
Neisseria Gonorrhea: NEGATIVE

## 2023-06-08 DIAGNOSIS — Z23 Encounter for immunization: Secondary | ICD-10-CM | POA: Diagnosis not present

## 2023-06-09 DIAGNOSIS — N3000 Acute cystitis without hematuria: Secondary | ICD-10-CM | POA: Diagnosis not present

## 2023-06-09 DIAGNOSIS — N898 Other specified noninflammatory disorders of vagina: Secondary | ICD-10-CM | POA: Diagnosis not present

## 2023-06-09 DIAGNOSIS — R350 Frequency of micturition: Secondary | ICD-10-CM | POA: Diagnosis not present

## 2023-06-09 DIAGNOSIS — Z3493 Encounter for supervision of normal pregnancy, unspecified, third trimester: Secondary | ICD-10-CM | POA: Diagnosis not present

## 2023-06-18 ENCOUNTER — Inpatient Hospital Stay: Payer: Medicaid Other | Admitting: Anesthesiology

## 2023-06-18 ENCOUNTER — Inpatient Hospital Stay
Admission: EM | Admit: 2023-06-18 | Discharge: 2023-06-20 | DRG: 807 | Disposition: A | Discharge status: Home / Self Care | Payer: Medicaid Other | Attending: Obstetrics | Admitting: Obstetrics

## 2023-06-18 ENCOUNTER — Encounter: Payer: Self-pay | Admitting: Obstetrics and Gynecology

## 2023-06-18 ENCOUNTER — Other Ambulatory Visit: Payer: Self-pay

## 2023-06-18 DIAGNOSIS — O99214 Obesity complicating childbirth: Principal | ICD-10-CM | POA: Diagnosis present

## 2023-06-18 DIAGNOSIS — Z3A40 40 weeks gestation of pregnancy: Secondary | ICD-10-CM

## 2023-06-18 DIAGNOSIS — Z3A00 Weeks of gestation of pregnancy not specified: Secondary | ICD-10-CM | POA: Diagnosis not present

## 2023-06-18 DIAGNOSIS — O479 False labor, unspecified: Principal | ICD-10-CM | POA: Diagnosis present

## 2023-06-18 DIAGNOSIS — O26893 Other specified pregnancy related conditions, third trimester: Secondary | ICD-10-CM | POA: Diagnosis not present

## 2023-06-18 LAB — CBC
HCT: 37.3 % (ref 36.0–46.0)
Hemoglobin: 13.1 g/dL (ref 12.0–15.0)
MCH: 30 pg (ref 26.0–34.0)
MCHC: 35.1 g/dL (ref 30.0–36.0)
MCV: 85.4 fL (ref 80.0–100.0)
Platelets: 213 10*3/uL (ref 150–400)
RBC: 4.37 MIL/uL (ref 3.87–5.11)
RDW: 13.6 % (ref 11.5–15.5)
WBC: 8.4 10*3/uL (ref 4.0–10.5)
nRBC: 0 % (ref 0.0–0.2)

## 2023-06-18 LAB — TYPE AND SCREEN
ABO/RH(D): O POS
Antibody Screen: NEGATIVE

## 2023-06-18 LAB — ABO/RH: ABO/RH(D): O POS

## 2023-06-18 MED ORDER — TERBUTALINE SULFATE 1 MG/ML IJ SOLN: 0.2500 mg | Freq: Once | INTRAMUSCULAR | Status: DC | PRN

## 2023-06-18 MED ORDER — PHENYLEPHRINE 80 MCG/ML (10ML) SYRINGE FOR IV PUSH (FOR BLOOD PRESSURE SUPPORT): 80.0000 ug | PREFILLED_SYRINGE | INTRAVENOUS | Status: DC | PRN

## 2023-06-18 MED ORDER — LACTATED RINGERS IV SOLN: INTRAVENOUS | Status: DC

## 2023-06-18 MED ORDER — ONDANSETRON HCL 4 MG/2ML IJ SOLN: 4.0000 mg | Freq: Four times a day (QID) | INTRAMUSCULAR | Status: DC | PRN

## 2023-06-18 MED ORDER — ZOLPIDEM TARTRATE 5 MG PO TABS: 5.0000 mg | ORAL_TABLET | Freq: Every evening | ORAL | Status: DC | PRN

## 2023-06-18 MED ORDER — LIDOCAINE-EPINEPHRINE (PF) 1.5 %-1:200000 IJ SOLN
INTRAMUSCULAR | Status: DC | PRN
  Administered 2023-06-18: 3 mL via EPIDURAL

## 2023-06-18 MED ORDER — FENTANYL-BUPIVACAINE-NACL 0.5-0.125-0.9 MG/250ML-% EP SOLN
EPIDURAL | Status: AC
  Filled 2023-06-18: qty 250

## 2023-06-18 MED ORDER — EPHEDRINE 5 MG/ML INJ: 10.0000 mg | INTRAVENOUS | Status: DC | PRN

## 2023-06-18 MED ORDER — LACTATED RINGERS IV SOLN
500.0000 mL | INTRAVENOUS | Status: DC | PRN
  Administered 2023-06-18: 500 mL via INTRAVENOUS

## 2023-06-18 MED ORDER — DOCUSATE SODIUM 100 MG PO CAPS: 100.0000 mg | ORAL_CAPSULE | Freq: Every day | ORAL | Status: DC

## 2023-06-18 MED ORDER — DIPHENHYDRAMINE HCL 50 MG/ML IJ SOLN: 12.5000 mg | INTRAMUSCULAR | Status: DC | PRN

## 2023-06-18 MED ORDER — LACTATED RINGERS IV SOLN
500.0000 mL | Freq: Once | INTRAVENOUS | Status: AC
  Administered 2023-06-18: 500 mL via INTRAVENOUS

## 2023-06-18 MED ORDER — FENTANYL CITRATE (PF) 100 MCG/2ML IJ SOLN: 50.0000 ug | INTRAMUSCULAR | Status: DC | PRN

## 2023-06-18 MED ORDER — FENTANYL-BUPIVACAINE-NACL 0.5-0.125-0.9 MG/250ML-% EP SOLN
12.0000 mL/h | EPIDURAL | Status: DC | PRN
  Administered 2023-06-18: 12 mL/h via EPIDURAL

## 2023-06-18 MED ORDER — LACTATED RINGERS IV SOLN: 125.0000 mL/h | INTRAVENOUS | Status: DC

## 2023-06-18 MED ORDER — OXYTOCIN-SODIUM CHLORIDE 30-0.9 UT/500ML-% IV SOLN
1.0000 m[IU]/min | INTRAVENOUS | Status: DC
  Administered 2023-06-18: 2 m[IU]/min via INTRAVENOUS

## 2023-06-18 MED ORDER — OXYTOCIN BOLUS FROM INFUSION
333.0000 mL | Freq: Once | INTRAVENOUS | Status: AC
  Administered 2023-06-18: 333 mL via INTRAVENOUS

## 2023-06-18 MED ORDER — LIDOCAINE HCL (PF) 1 % IJ SOLN: 30.0000 mL | INTRAMUSCULAR | Status: DC | PRN

## 2023-06-18 MED ORDER — PRENATAL MULTIVITAMIN CH: 1.0000 | ORAL_TABLET | Freq: Every day | ORAL | Status: DC

## 2023-06-18 MED ORDER — SOD CITRATE-CITRIC ACID 500-334 MG/5ML PO SOLN: 30.0000 mL | ORAL | Status: DC | PRN

## 2023-06-18 MED ORDER — CALCIUM CARBONATE ANTACID 500 MG PO CHEW: 2.0000 | CHEWABLE_TABLET | ORAL | Status: DC | PRN

## 2023-06-18 MED ORDER — BUPIVACAINE HCL (PF) 0.25 % IJ SOLN
INTRAMUSCULAR | Status: DC | PRN
  Administered 2023-06-18: 8 mL via EPIDURAL

## 2023-06-18 MED ORDER — ACETAMINOPHEN 325 MG PO TABS: 650.0000 mg | ORAL_TABLET | ORAL | Status: DC | PRN

## 2023-06-18 MED ORDER — OXYTOCIN-SODIUM CHLORIDE 30-0.9 UT/500ML-% IV SOLN
2.5000 [IU]/h | INTRAVENOUS | Status: DC
  Administered 2023-06-18: 2.5 [IU]/h via INTRAVENOUS
  Filled 2023-06-18: qty 500

## 2023-06-18 NOTE — Anesthesia Procedure Notes (Signed)
Epidural Patient location during procedure: OB Start time: 06/18/2023 5:29 PM End time: 06/18/2023 5:37 PM  Staffing Anesthesiologist: Reed Breech, MD Performed: anesthesiologist   Preanesthetic Checklist Completed: patient identified, IV checked, risks and benefits discussed, surgical consent, monitors and equipment checked, pre-op evaluation and timeout performed  Epidural Patient position: sitting Prep: Betadine Patient monitoring: heart rate, continuous pulse ox and blood pressure Approach: midline Location: L3-L4 Injection technique: LOR air  Needle:  Needle type: Tuohy  Needle gauge: 17 G Needle length: 9 cm Needle insertion depth: 5.5 cm Catheter at skin depth: 10.5 cm Test dose: negative and 1.5% lidocaine with Epi 1:200 K  Assessment Sensory level: T4  Additional Notes Straightforward placement without apparent complications. Reason for block:procedure for pain

## 2023-06-18 NOTE — Progress Notes (Signed)
L&D Note    Subjective:  has no unusual complaints  Objective:   Vitals:   06/18/23 1756 06/18/23 1800 06/18/23 1816 06/18/23 1847  BP: 124/68 115/82 117/70 111/74  Pulse: 75 (!) 107 76 (!) 105  Resp:      Temp:      TempSrc:      SpO2:  99%  98%  Weight:      Height:        Current Vital Signs 24h Vital Sign Ranges  T 97.8 F (36.6 C) Temp  Avg: 97.9 F (36.6 C)  Min: 97.8 F (36.6 C)  Max: 97.9 F (36.6 C)  BP 111/74 BP  Min: 111/74  Max: 125/69  HR (!) 105 Pulse  Avg: 85.9  Min: 74  Max: 107  RR 18 Resp  Avg: 17  Min: 16  Max: 18  SaO2 98 %   SpO2  Avg: 98.8 %  Min: 98 %  Max: 99 %      Gen: alert, cooperative, no distress FHR: Baseline: 135 bpm, Variability: moderate, Accels: Present, Decels: none Toco: regular, every 5-8 minutes SVE: Dilation: 5 Effacement (%): 70 Cervical Position: Posterior Station: -3 Presentation: Vertex Exam by:: Greeson RN  Medications SCHEDULED MEDICATIONS   docusate sodium  100 mg Oral Daily   oxytocin 40 units in LR 1000 mL  333 mL Intravenous Once   prenatal multivitamin  1 tablet Oral Q1200    MEDICATION INFUSIONS   fentaNYL 2 mcg/mL w/bupivacaine 0.125% in NS 250 mL 12 mL/hr (06/18/23 1742)   lactated ringers     [START ON 06/19/2023] lactated ringers     lactated ringers 125 mL/hr at 06/18/23 1815   oxytocin     oxytocin      PRN MEDICATIONS  acetaminophen, acetaminophen, calcium carbonate, diphenhydrAMINE, ePHEDrine, ePHEDrine, fentaNYL (SUBLIMAZE) injection, fentaNYL 2 mcg/mL w/bupivacaine 0.125% in NS 250 mL, lactated ringers, lidocaine (PF), ondansetron, phenylephrine, phenylephrine, sodium citrate-citric acid, terbutaline, zolpidem   Assessment & Plan:  32 y.o. [id number] at [redacted]w[redacted]d admitted for active labor -Labor: Inadequate uterine activity - intensity or frequency. -Fetal Well-being: Category I -GBS: negative -Membranes ruptured, meconium light -Anticipate vaginal delivery. and Intervention: IV Pitocin augmentation,  increase Pitocin rate, and change maternal position -Analgesia: regional anesthesia   Chari Manning, CNM  06/18/2023 6:57 PM  [facility]

## 2023-06-18 NOTE — OB Triage Note (Signed)
Patient is a 32 yo, G4P3, at 40 weeks 0 days. Patient presents with complaints of ctx that started about two days ago and she noticed a change in frequency and intensity around 0800 this morning. Patient states the contractions are currently 5 minutes apart and rates them 5/10 on the pain scale. Patient reports intermittent rectal pressure. Patient denies any vaginal bleeding or LOF. Patient reports FM has been decreased today. Monitors applied and assessing. VSS. Initial fetal heart tone 135. SVE 5/70/-3. Rubye Oaks CNM notified of patients arrival to unit. Plan to transfer to [facility] for admission.

## 2023-06-18 NOTE — H&P (Signed)
OB History & Physical   History of Present Illness:   Chief Complaint: uterine contractions  HPI:  Joy Mckay is a 32 y.o. G1P0 female at [redacted]w[redacted]d, No LMP recorded. Patient is pregnant., consistent with [place] at [redacted]w[redacted]d, with Estimated Date of Delivery: 06/18/23.  She presents to L&D for active labor  Reports active fetal movement  Contractions: every 2 to 5 minutes LOF/SROM: intact Vaginal bleeding: none  Factors complicating pregnancy:  Obesity Elevated 1 hr gtt normal 3hr  Patient Active Problem List   Diagnosis Date Noted   Uterine contractions 06/18/2023   Normal labor and delivery 06/18/2023    Prenatal Transfer Tool  Maternal Diabetes: No Genetic Screening: Normal Maternal Ultrasounds/Referrals: Normal Fetal Ultrasounds or other Referrals:  None Maternal Substance Abuse:  No Significant Maternal Medications:  None Significant Maternal Lab Results: Group B Strep negative  Maternal Medical History:  History reviewed. No pertinent past medical history.  History reviewed. No pertinent surgical history.  No Known Allergies  Prior to Admission medications   Medication Sig Start Date End Date Taking? Authorizing Provider  Prenatal Vit-Fe Fumarate-FA (PRENATAL MULTIVITAMIN) TABS tablet Take 1 tablet by mouth daily at 12 noon.   Yes [provider]  tacrolimus (PROTOPIC) 0.1 % ointment Apply topically 2 (two) times daily. 12/24/20   Willeen Niece, MD  TRI-ESTARYLLA 0.18/0.215/0.25 MG-35 MCG tablet Take 1 tablet by mouth daily. 12/24/20   [provider]     Prenatal care site:  [facility]  OB History  Gravida Para Term Preterm AB Living  1 0 0 0 0 0  SAB IAB Ectopic Multiple Live Births  0 0 0 0 0    # Outcome Date GA Lbr Len/2nd Weight Sex Type Anes PTL Lv  1 Current              Social History: She  reports that she has never smoked. She has never used smokeless tobacco. She reports that she does not drink alcohol and does not use  drugs.  Family History: family history is not on file.   Review of Systems: A full review of systems was performed and negative except as noted in the HPI.     Physical Exam:  Vital Signs: BP 122/78 (BP Location: Right Arm)   Pulse 81   Temp 97.9 F (36.6 C) (Oral)   Resp 16   Ht 5\' 1"  (1.549 m)   Wt 80.7 kg   BMI 33.63 kg/m   General: no acute distress.  HEENT: normocephalic, atraumatic Heart: regular rate & rhythm Lungs: normal respiratory effort Abdomen: soft, gravid, non-tender;   Pelvic:   External: Normal external female genitalia  Cervix: Dilation: 5 / Effacement (%): 70 / Station: -3    Extremities: non-tender, symmetric, no edema bilaterally.  DTRs: +2  Neurologic: Alert & oriented x 3.    No results found for this or any previous visit (from the past 24 hour(s)).  Pertinent Results:  Prenatal Labs: Blood type/Rh O pos  Antibody screen Negative    Rubella immune    Varicella Immune  RPR NR    HBsAg NR   Hep C NR   HIV NR    GC neg  Chlamydia neg  Genetic screening cfDNA negative   1 hour GTT 139  3 hour GTT 85, 146, 130, 118   GBS neg     FHT:  FHR: 155 bpm, variability: moderate,  accelerations:  Present,  decelerations:  Absent Category/reactivity:  Category I UC:  regular, every 2-5 minutes   Cephalic by Leopolds and SVE   No results found.  Assessment:  Joy Mckay is a 32 y.o. G1P0 female at [redacted]w[redacted]d with active labor.   Plan:  1. Admit to Labor & Delivery - consents reviewed and obtained - .Dr. Edison Pace MD notified of admission and plan of care   2. Fetal Well being  - Fetal Tracing: category 1 - Group B Streptococcus ppx not indicated: GBS negative - Presentation: cephalic confirmed by SVE   3. Routine OB: - Prenatal labs reviewed, as above - Rh positive - CBC, T&S, RPR on admit - Clear liquid diet , continuous IV fluids  4. Monitoring of labor  - Contractions monitored with external toco - Pelvis proven to 7.11 lbs   - Plan for expectant management  - Augmentation with AROM as appropriate  - Plan for  continuous fetal monitoring - Maternal pain control as desired; planning regional anesthesia - Anticipate vaginal delivery    5. Post Partum Planning: - Infant feeding: breast and formula feeding - Contraception:  Nexplanon - Tdap vaccine: Given prenatally - Flu vaccine: Given prenatally  Chari Manning, CNM 06/18/23 2:11 PM  Chari Manning, CNM Certified Nurse Midwife [facility]

## 2023-06-18 NOTE — Anesthesia Preprocedure Evaluation (Addendum)
Anesthesia Evaluation  Patient identified by MRN, date of birth, ID band Patient awake    Reviewed: Allergy & Precautions, NPO status , Patient's Chart, lab work & pertinent test results  History of Anesthesia Complications Negative for: history of anesthetic complications  Airway Mallampati: II   Neck ROM: Full    Dental   Pulmonary neg pulmonary ROS   Pulmonary exam normal breath sounds clear to auscultation       Cardiovascular Exercise Tolerance: Good negative cardio ROS Normal cardiovascular exam Rhythm:Regular Rate:Normal     Neuro/Psych negative neurological ROS     GI/Hepatic negative GI ROS,,,  Endo/Other  Obesity   Renal/GU negative Renal ROS     Musculoskeletal   Abdominal   Peds  Hematology negative hematology ROS (+)   Anesthesia Other Findings 32 yo G4P3003 at 46 0/7 requesting labor epidural.  Reproductive/Obstetrics (+) Pregnancy                             Anesthesia Physical Anesthesia Plan  ASA: 2  Anesthesia Plan: Epidural   Post-op Pain Management:    Induction:   PONV Risk Score and Plan: 2 and Treatment may vary due to age or medical condition  Airway Management Planned: Natural Airway  Additional Equipment:   Intra-op Plan:   Post-operative Plan:   Informed Consent: I have reviewed the patients History and Physical, chart, labs and discussed the procedure including the risks, benefits and alternatives for the proposed anesthesia with the patient or authorized representative who has indicated his/her understanding and acceptance.     Dental Advisory Given  Plan Discussed with:   Anesthesia Plan Comments: (Patient reports no bleeding problems and no anticoagulant use.   Patient consented for risks of anesthesia including but not limited to:  - adverse reactions to medications - risk of bleeding, infection and or nerve damage from epidural  that could lead to paralysis - risk of headache or failed epidural - nerve damage due to positioning - that if epidural is used for C-section that there is a chance of epidural failure requiring spinal placement or conversion to GA - damage to heart, brain, lungs, other parts of body or loss of life  Patient voiced understanding.)       Anesthesia Quick Evaluation

## 2023-06-18 NOTE — Discharge Summary (Signed)
Obstetrical Discharge Summary  Patient Name: Joy Mckay DOB: [birth year] MRN: [id number]  Date of admission: 06/18/2023 Delivery date:06/18/2023 Delivering provider: Chari Manning Date of discharge: 06/20/2023  Primary OB: [facility] LMP:08/10/22 [facility] Estimated Date of Delivery: 06/18/23 Gestational Age at Delivery: [redacted]w[redacted]d  Antepartum complications:  Obesity Elevated 1 hr gtt normal 3hr  Prenatal Labs:   Blood type/Rh O pos  Antibody screen Negative    Rubella immune    Varicella Immune  RPR NR    HBsAg NR   Hep C NR   HIV NR    GC neg  Chlamydia neg  Genetic screening cfDNA negative   1 hour GTT 139  3 hour GTT 85, 146, 130, 118   GBS neg       Admitting diagnosis: Uterine contractions [O47.9] Normal labor and delivery [O80] Intrauterine pregnancy: [redacted]w[redacted]d     Secondary diagnosis:  Principal Problem:   Uterine contractions Active Problems:   Normal labor and delivery  Additional problems: none  Discharge diagnosis: Term Pregnancy Delivered                                              Post partum procedures: none Augmentation: AROM and Pitocin Complications: None  Intrapartum complications/course: Joy Mckay presented to L&D with uterine contractions. She was 5/70/-3. She progressed  to C/C/+2 with a spontaneous urge to push.  Baby spontaneously delivered without maternal effort for a spontaneous vaginal birth.  Delivery Type: spontaneous vaginal delivery Anesthesia: epidural anesthesia Placenta: spontaneous To Pathology: No  Laceration: n/a Episiotomy: none  Newborn Delivery   Birth date/time: 06/18/2023 21:26:00 Delivery type: Vaginal, Spontaneous      Edinburgh:     06/19/2023    4:30 AM 06/19/2023   12:10 AM  Edinburgh Postnatal Depression Scale Screening Tool  I have been able to laugh and see the funny side of things. 0 --  I have looked forward with enjoyment to things. 0   I have blamed myself unnecessarily when things went  wrong. 0   I have been anxious or worried for no good reason. 0   I have felt scared or panicky for no good reason. 0   Things have been getting on top of me. 0   I have been so unhappy that I have had difficulty sleeping. 0   I have felt sad or miserable. 0   I have been so unhappy that I have been crying. 0   The thought of harming myself has occurred to me. 0   Edinburgh Postnatal Depression Scale Total 0      Hospital course: Onset of Labor With Vaginal Delivery      32 y.o. yo [id number] at [redacted]w[redacted]d was admitted in Active Labor on 06/18/2023. Labor course was uncomplicated.  Membrane Rupture Time/Date: 6:48 PM,06/18/2023  Delivery Method:Vaginal, Spontaneous Operative Delivery:N/A Episiotomy: None Lacerations:  None Patient had a postpartum course with no complications.  She is ambulating, tolerating a regular diet, passing flatus, and urinating well. Patient is discharged home in stable condition on 06/20/23.  Newborn Data: Birth date:06/18/2023 Birth time:9:26 PM Gender:Female "[name]" Living status:Living Apgars:8 ,9  Weight:3040 g Transfusion:No  Discharge Physical Exam:  BP 124/81 (BP Location: Right Arm)   Pulse 88   Temp 98.2 F (36.8 C) (Oral)   Resp 19   Ht 5\' 1"  (1.549 m)   Wt 80.7 kg  SpO2 98%   Breastfeeding Unknown   BMI 33.63 kg/m   General: alert, cooperative, and no distress Pulm:  nl effort ABD: s/nd/nt, fundus firm and below the umbilicus Lochia: appropriate Uterine Fundus: firm Perineum: minimal edema/intact DVT Evaluation: LE non-ttp, no evidence of DVT on exam.  Hemoglobin  Date Value Ref Range Status  06/19/2023 12.2 12.0 - 15.0 g/dL Final  16/06/9603 54.0 11.1 - 15.9 g/dL Final   HCT  Date Value Ref Range Status  06/19/2023 35.2 (L) 36.0 - 46.0 % Final   Hematocrit  Date Value Ref Range Status  02/05/2015 44.2 34.0 - 46.6 % Final    Risk assessment for postpartum VTE and prophylactic treatment: Very high risk factors: None High  risk factors: None Moderate risk factors: BMI 30-40 kg/m2  Postpartum VTE prophylaxis with LMWH not indicated  Disposition: stable, discharge to home. Baby Feeding: breast and formula feeding Baby Disposition: home with mom  Rh Immune globulin indicated: No MMR vaccine given: was not indicated Varivax vaccine given: was not indicated Flu vaccine given in AP setting: Yes  Tdap vaccine given in AP setting: Yes   Discharge home in stable condition Infant Feeding: Bottle and Breast Infant Disposition:home with mother Discharge instruction: per After Visit Summary and Postpartum booklet. Activity: Advance as tolerated. Pelvic rest for 6 weeks.  Diet: routine diet Anticipated Birth Control: Nexplanon Postpartum Appointment:6 weeks Additional Postpartum F/U:  none Future Appointments:No future appointments. Follow up Visit:  Follow-up Information     Chari Manning, CNM Follow up in 6 week(s).   Specialty: Obstetrics Why: pp visit and Nexplanon placement Contact information: [address] [place] [address] [phone]                  Discharge Medications: Allergies as of 06/20/2023   No Known Allergies      Medication List     STOP taking these medications    prenatal multivitamin Tabs tablet   tacrolimus 0.1 % ointment Commonly known as: PROTOPIC   Tri-Estarylla 0.18/0.215/0.25 MG-35 MCG tablet Generic drug: Norgestimate-Ethinyl Estradiol Triphasic       TAKE these medications    acetaminophen 325 MG tablet Commonly known as: Tylenol Take 2 tablets (650 mg total) by mouth every 6 (six) hours as needed for mild pain or fever (for pain scale < 4).   benzocaine-Menthol 20-0.5 % Aero Commonly known as: DERMOPLAST Apply 1 Application topically as needed for irritation (perineal discomfort).   ibuprofen 600 MG tablet Commonly known as: ADVIL Take 1 tablet (600 mg total) by mouth every 6 (six) hours as needed for fever, mild pain,  cramping or headache.   senna-docusate 8.6-50 MG tablet Commonly known as: Senokot-S Take 2 tablets by mouth at bedtime as needed for mild constipation.   witch hazel-glycerin pad Commonly known as: TUCKS Apply 1 Application topically as needed for hemorrhoids (for pain).         Follow-up Information     Chari Manning, CNM Follow up in 6 week(s).   Specialty: Obstetrics Why: pp visit and Nexplanon placement Contact information: [address] [place] [address] [phone]                 Signed:  Janyce Llanos, CNM 06/20/2023 7:56 AM

## 2023-06-19 ENCOUNTER — Encounter: Payer: Self-pay | Admitting: Obstetrics and Gynecology

## 2023-06-19 LAB — CBC
HCT: 35.2 % — ABNORMAL LOW (ref 36.0–46.0)
Hemoglobin: 12.2 g/dL (ref 12.0–15.0)
MCH: 29.8 pg (ref 26.0–34.0)
MCHC: 34.7 g/dL (ref 30.0–36.0)
MCV: 86.1 fL (ref 80.0–100.0)
Platelets: 195 10*3/uL (ref 150–400)
RBC: 4.09 MIL/uL (ref 3.87–5.11)
RDW: 13.8 % (ref 11.5–15.5)
WBC: 10.4 10*3/uL (ref 4.0–10.5)
nRBC: 0 % (ref 0.0–0.2)

## 2023-06-19 LAB — RPR: RPR Ser Ql: NONREACTIVE

## 2023-06-19 MED ORDER — IBUPROFEN 600 MG PO TABS
600.0000 mg | ORAL_TABLET | Freq: Four times a day (QID) | ORAL | Status: DC
  Administered 2023-06-19 – 2023-06-20 (×7): 600 mg via ORAL
  Filled 2023-06-19 (×7): qty 1

## 2023-06-19 MED ORDER — PRENATAL MULTIVITAMIN CH
1.0000 | ORAL_TABLET | Freq: Every day | ORAL | Status: DC
  Administered 2023-06-19 – 2023-06-20 (×2): 1 via ORAL
  Filled 2023-06-19 (×2): qty 1

## 2023-06-19 MED ORDER — BENZOCAINE-MENTHOL 20-0.5 % EX AERO: 1.0000 | INHALATION_SPRAY | CUTANEOUS | Status: DC | PRN

## 2023-06-19 MED ORDER — BISACODYL 10 MG RE SUPP: 10.0000 mg | Freq: Every day | RECTAL | Status: DC | PRN

## 2023-06-19 MED ORDER — SIMETHICONE 80 MG PO CHEW: 80.0000 mg | CHEWABLE_TABLET | ORAL | Status: DC | PRN

## 2023-06-19 MED ORDER — OXYCODONE HCL 5 MG PO TABS: 5.0000 mg | ORAL_TABLET | ORAL | Status: DC | PRN

## 2023-06-19 MED ORDER — WITCH HAZEL-GLYCERIN EX PADS: 1.0000 | MEDICATED_PAD | CUTANEOUS | Status: DC | PRN

## 2023-06-19 MED ORDER — SODIUM CHLORIDE 0.9 % IV SOLN: 250.0000 mL | INTRAVENOUS | Status: DC | PRN

## 2023-06-19 MED ORDER — SODIUM CHLORIDE 0.9% FLUSH: 3.0000 mL | INTRAVENOUS | Status: DC | PRN

## 2023-06-19 MED ORDER — ONDANSETRON HCL 4 MG/2ML IJ SOLN: 4.0000 mg | INTRAMUSCULAR | Status: DC | PRN

## 2023-06-19 MED ORDER — COCONUT OIL OIL
1.0000 | TOPICAL_OIL | Status: DC | PRN
  Administered 2023-06-20: 1 via TOPICAL
  Filled 2023-06-19 (×2): qty 7.5

## 2023-06-19 MED ORDER — DIBUCAINE (PERIANAL) 1 % EX OINT: 1.0000 | TOPICAL_OINTMENT | CUTANEOUS | Status: DC | PRN

## 2023-06-19 MED ORDER — ZOLPIDEM TARTRATE 5 MG PO TABS: 5.0000 mg | ORAL_TABLET | Freq: Every evening | ORAL | Status: DC | PRN

## 2023-06-19 MED ORDER — ONDANSETRON HCL 4 MG PO TABS: 4.0000 mg | ORAL_TABLET | ORAL | Status: DC | PRN

## 2023-06-19 MED ORDER — ACETAMINOPHEN 325 MG PO TABS
650.0000 mg | ORAL_TABLET | ORAL | Status: DC | PRN
  Administered 2023-06-19 – 2023-06-20 (×2): 650 mg via ORAL
  Filled 2023-06-19 (×2): qty 2

## 2023-06-19 MED ORDER — FLEET ENEMA RE ENEM: 1.0000 | ENEMA | Freq: Every day | RECTAL | Status: DC | PRN

## 2023-06-19 MED ORDER — DIPHENHYDRAMINE HCL 25 MG PO CAPS: 25.0000 mg | ORAL_CAPSULE | Freq: Four times a day (QID) | ORAL | Status: DC | PRN

## 2023-06-19 MED ORDER — SENNOSIDES-DOCUSATE SODIUM 8.6-50 MG PO TABS
2.0000 | ORAL_TABLET | ORAL | Status: DC
  Administered 2023-06-19: 2 via ORAL
  Filled 2023-06-19: qty 2

## 2023-06-19 MED ORDER — SODIUM CHLORIDE 0.9% FLUSH
3.0000 mL | Freq: Two times a day (BID) | INTRAVENOUS | Status: DC
  Administered 2023-06-19 (×3): 3 mL via INTRAVENOUS

## 2023-06-19 NOTE — Lactation Note (Signed)
This note was copied from a baby's chart. Lactation Consultation Note  Patient Name: Joy Mckay [id number] Date: 06/19/2023 Age:31 hours Reason for consult: Initial assessment;Term   Maternal Data Has patient been taught Hand Expression?: Yes Does the patient have breastfeeding experience prior to this delivery?: Yes How long did the patient breastfeed?: 1 yr  Feeding Mother's Current Feeding Choice: Breast Milk and Formula Nipple Type: Slow - flow Mom  states she will be returning to work soon, wants to breast and formula feed, baby only formula fed last feed, encouraged to breast feed at this session, baby latched easily after mom hand expressed drops of colostrum easily, nursing well with occ swallow noted, sl sleepy and needs stimulation to continue nursing  [facility] Score Latch: Grasps breast easily, tongue down, lips flanged, rhythmical sucking.  Audible Swallowing: A few with stimulation  Type of Nipple: Everted at rest and after stimulation  Comfort (Breast/Nipple): Soft / non-tender  Hold (Positioning): No assistance needed to correctly position infant at breast.  LATCH Score: 9   Lactation Tools Discussed/Used  Mom requested Harmony breast pump , reviewed use of pump as she has used this pump before.    Interventions Interventions: Breast feeding basics reviewed;Skin to skin;Hand express;Hand pump;Education  Discharge Pump: Manual WIC Program: Yes  Consult Status Consult Status: PRN    Joy Mckay 06/19/2023, 1:37 PM

## 2023-06-19 NOTE — Progress Notes (Signed)
Post Partum Day 1 Subjective: Doing well, no complaints.  Tolerating regular diet, pain with PO meds, voiding and ambulating without difficulty.  No CP SOB Fever,Chills, N/V or leg pain; denies nipple or breast pain, no HA change of vision, RUQ/epigastric pain  Objective: BP (!) 100/56 (BP Location: Right Arm)   Pulse 72   Temp 98.3 F (36.8 C) (Oral)   Resp 18   Ht 5\' 1"  (1.549 m)   Wt 80.7 kg   SpO2 100% Comment: Room Air  Breastfeeding Unknown   BMI 33.63 kg/m    Physical Exam:  General: NAD Breasts: soft/nontender CV: RRR Pulm: nl effort, CTABL Abdomen: soft, NT, BS x 4 Perineum: minimal edema, intact Lochia: moderate Uterine Fundus: fundus firm and 1 fb below umbilicus DVT Evaluation: no cords, ttp LEs   Recent Labs    06/18/23 1431 06/19/23 0539  HGB 13.1 12.2  HCT 37.3 35.2*  WBC 8.4 10.4  PLT 213 195    Assessment/Plan: 32 y.o. [id number] postpartum day # 1  - Continue routine PP care - Lactation consult PRN  - Planning Nexplanon for contraception in the office - No anemia present - Immunization status: all Imms up to date  Disposition: Does not desire Dc home today.   Janyce Llanos, CNM 06/19/2023 12:16 PM

## 2023-06-19 NOTE — Anesthesia Postprocedure Evaluation (Signed)
Anesthesia Post Note  Patient: Joy Mckay  Procedure(s) Performed: AN AD HOC LABOR EPIDURAL  Patient location during evaluation: Mother Baby Anesthesia Type: Epidural Level of consciousness: awake and alert Pain management: pain level controlled Vital Signs Assessment: post-procedure vital signs reviewed and stable Respiratory status: spontaneous breathing, nonlabored ventilation and respiratory function stable Cardiovascular status: stable Postop Assessment: no headache, no backache, patient able to bend at knees and able to ambulate Anesthetic complications: no   No notable events documented.   Last Vitals:  Vitals:   06/19/23 0110 06/19/23 0430  BP: 118/70 (!) 118/58  Pulse: 89 74  Resp: 18 19  Temp: 37.1 C 36.7 C  SpO2: 99% 98%    Last Pain:  Vitals:   06/19/23 0430  TempSrc: Oral  PainSc: 0-No pain                 Cleda Mccreedy Jalyiah Shelley

## 2023-06-20 MED ORDER — BENZOCAINE-MENTHOL 20-0.5 % EX AERO: 1.0000 | INHALATION_SPRAY | CUTANEOUS | 0 refills | Status: AC | PRN

## 2023-06-20 MED ORDER — IBUPROFEN 600 MG PO TABS: 600.0000 mg | ORAL_TABLET | Freq: Four times a day (QID) | ORAL | 0 refills | Status: AC | PRN

## 2023-06-20 MED ORDER — WITCH HAZEL-GLYCERIN EX PADS: 1.0000 | MEDICATED_PAD | CUTANEOUS | 0 refills | Status: AC | PRN

## 2023-06-20 MED ORDER — SENNOSIDES-DOCUSATE SODIUM 8.6-50 MG PO TABS: 2.0000 | ORAL_TABLET | Freq: Every evening | ORAL | 0 refills | Status: AC | PRN

## 2023-06-20 MED ORDER — ACETAMINOPHEN 325 MG PO TABS: 650.0000 mg | ORAL_TABLET | Freq: Four times a day (QID) | ORAL | 0 refills | Status: AC | PRN

## 2023-06-20 NOTE — Lactation Note (Signed)
This note was copied from a baby's chart. Lactation Consultation Note  Patient Name: Joy Mckay [id number] Date: 06/20/2023 Age:31 hours Reason for consult: Follow-up assessment;Term   Maternal Data This is mom's 4th baby. Mom with history of obesity. Mom is an experienced breastfeeding mother.  On follow-up today mom reports baby is breastfeeding and bottle feeding well, having a number wet and stool diaper. Mom with one concern regarding her left nipple is more tender than right. Upon assessment mom's left nipple has small compression stripe.  Has patient been taught Hand Expression?: Yes Does the patient have breastfeeding experience prior to this delivery?: Yes How long did the patient breastfeed?: 1 yr  Feeding Mother's Current Feeding Choice: Breast Milk and Formula Nipple Type: Slow - flow  Interventions Interventions: Breast feeding basics reviewed;Coconut oil;Position options;Education Provided mom with tips and strategies to maximize positioning and latch technique. Recommended mom hand express some colostrum prior to latching baby at the left breast, consider offering baby to right breast first a few feeds in a row, alternate positions at the left breast, check both bottom and upper lip are flanged outward onto areola and correct as needed, apply coconut oil to her nipple after each breastfeeding session until mom's nipple is healed.  Discharge Discharge Education: Engorgement and breast care;Warning signs for feeding baby;Outpatient recommendation Pump: Manual  Consult Status Consult Status: Complete Date: 06/20/23 Follow-up type: In-patient  Update provided to care nurse.  Fuller Song 06/20/2023, 11:14 AM

## 2023-06-20 NOTE — Discharge Instructions (Signed)
Call the office to schedule appointment for 6 weeks postpartum and nexplanon  placement.  If you have questions or concerns  you should call the office or the on call provider. If you have urgent concerns go to the nearest emergency department for evaluation.

## 2023-06-20 NOTE — Progress Notes (Signed)
Discharge instructions reviewed with patient and significant other.  Printed copies given to patient for reference after discharge home.  Questions answered and follow up care reviewed.

## 2023-07-23 DIAGNOSIS — Z30017 Encounter for initial prescription of implantable subdermal contraceptive: Secondary | ICD-10-CM | POA: Diagnosis not present

## 2023-07-23 DIAGNOSIS — Z3202 Encounter for pregnancy test, result negative: Secondary | ICD-10-CM | POA: Diagnosis not present

## 2023-07-23 DIAGNOSIS — Z1332 Encounter for screening for maternal depression: Secondary | ICD-10-CM | POA: Diagnosis not present

## 2024-05-23 DIAGNOSIS — L409 Psoriasis, unspecified: Secondary | ICD-10-CM | POA: Diagnosis not present

## 2024-05-23 DIAGNOSIS — B351 Tinea unguium: Secondary | ICD-10-CM | POA: Diagnosis not present
